# Patient Record
Sex: Male | Born: 1990 | Hispanic: Yes | Marital: Single | Smoking: Never smoker
Health system: Southern US, Community
[De-identification: ages and names within clinical notes are randomized; demographics above are authoritative.]

---

## 2019-11-20 ENCOUNTER — Emergency Department (HOSPITAL_COMMUNITY)
Admission: EM | Admit: 2019-11-20 | Discharge: 2019-11-20 | Disposition: A | Discharge status: Skilled Nursing Facility | Payer: Self-pay

## 2019-11-20 ENCOUNTER — Encounter: Payer: Self-pay | Admitting: Emergency Medicine

## 2019-11-20 ENCOUNTER — Ambulatory Visit
Admission: EM | Admit: 2019-11-20 | Discharge: 2019-11-20 | Disposition: A | Discharge status: Home / Self Care | Payer: Self-pay

## 2019-11-20 ENCOUNTER — Ambulatory Visit (INDEPENDENT_AMBULATORY_CARE_PROVIDER_SITE_OTHER): Payer: Self-pay

## 2019-11-20 ENCOUNTER — Other Ambulatory Visit: Payer: Self-pay

## 2019-11-20 DIAGNOSIS — M79644 Pain in right finger(s): Secondary | ICD-10-CM

## 2019-11-20 DIAGNOSIS — S62616A Displaced fracture of proximal phalanx of right little finger, initial encounter for closed fracture: Secondary | ICD-10-CM

## 2019-11-20 DIAGNOSIS — W11XXXA Fall on and from ladder, initial encounter: Secondary | ICD-10-CM

## 2019-11-20 NOTE — Discharge Instructions (Signed)
Please go to [facility] for further evaluation

## 2019-11-20 NOTE — ED Provider Notes (Signed)
[facility]    CSN: [id number] Arrival date & time: 11/20/19  1527      History   Chief Complaint Chief Complaint  Patient presents with  . Finger Injury    HPI Mirl Hillery is a 29 y.o. male.   29 year old male comes in for right fifth finger pain, swelling after fall 1 week ago. HPI obtained by patient through video translator. States fell off the ladder and landed onto the finger.  States had immediate swelling that has not improved, but has not worsened.  Has numbness, tingling to the fingers intermittently.  Thought this was going to resolve on own, and has been treating with ice compress, Tylenol/Motrin without relief. Given continued symptoms, came in for evaluation.      History reviewed. No pertinent past medical history.  There are no problems to display for this patient.   History reviewed. No pertinent surgical history.     Home Medications    Prior to Admission medications   Medication Sig Start Date End Date Taking? Authorizing Provider  acetaminophen (TYLENOL) 325 MG tablet Take 650 mg by mouth every 6 (six) hours as needed.   Yes [provider]    Family History Family History  Problem Relation Age of Onset  . Hypertension Mother     Social History Social History   Tobacco Use  . Smoking status: Never Smoker  Substance Use Topics  . Alcohol use: Yes  . Drug use: Not on file     Allergies   Patient has no known allergies.   Review of Systems Review of Systems  Reason unable to perform ROS: See HPI as above.     Physical Exam Triage Vital Signs ED Triage Vitals  Enc Vitals Group     BP 11/20/19 1546 112/69     Pulse Rate 11/20/19 1546 76     Resp 11/20/19 1546 18     Temp 11/20/19 1546 98.3 F (36.8 C)     Temp Source 11/20/19 1546 Oral     SpO2 11/20/19 1546 98 %     Weight --      Height --      Head Circumference --      Peak Flow --      Pain Score 11/20/19 1538 7     Pain Loc --      Pain  Edu? --      Excl. in GC? --    No data found.  Updated Vital Signs BP 112/69 (BP Location: Left Arm)   Pulse 76   Temp 98.3 F (36.8 C) (Oral)   Resp 18   SpO2 98%   Physical Exam Constitutional:      General: He is not in acute distress.    Appearance: Normal appearance. He is well-developed. He is not toxic-appearing or diaphoretic.  HENT:     Head: Normocephalic and atraumatic.  Eyes:     Conjunctiva/sclera: Conjunctivae normal.     Pupils: Pupils are equal, round, and reactive to light.  Pulmonary:     Effort: Pulmonary effort is normal. No respiratory distress.     Comments: Speaking in full sentences without difficulty Musculoskeletal:     Cervical back: Normal range of motion and neck supple.     Comments: Swelling, erythema, fluctuance to the right 5th PIP joint with scabbing. Tenderness to palpation of the joint. Digit flexed at 90 degrees, unable to move. NVI  Skin:    General: Skin is warm and dry.  Neurological:     Mental Status: He is alert and oriented to person, place, and time.      UC Treatments / Results  Labs (all labs ordered are listed, but only abnormal results are displayed) Labs Reviewed - No data to display  EKG   Radiology DG Finger Little Right  Result Date: 11/20/2019 CLINICAL DATA:  29 year old male status post fall from ladder 1 week ago with 5th finger injury. Pain and swelling. EXAM: RIGHT LITTLE FINGER 2+V COMPARISON:  None. FINDINGS: Impacted fracture at the right 5th PIP, where the base of the middle phalanx appears to be impacted on the condyle of the proximal phalanx. Little if any comminution. The 5th DIP appears preserved. Regional soft tissue swelling. The 5th metatarsal appears intact. IMPRESSION: Impacted fracture of the right 5th PIP with soft tissue swelling. Electronically Signed   By: Genevie Ann M.D.   On: 11/20/2019 16:28    Procedures Procedures (including critical care time)  Medications Ordered in UC Medications -  No data to display  Initial Impression / Assessment and Plan / UC Course  I have reviewed the triage vital signs and the nursing notes.  Pertinent labs & imaging results that were available during my care of the patient were reviewed by me and considered in my medical decision making (see chart for details).    Dr Lanny Cramp present during exam.  Given history, x-ray results, he suggested discharge to [facility] urgent care for further evaluation and management needed.  Resources provided, patient discharged in stable condition to [facility] urgent care for further evaluation management.  Final Clinical Impressions(s) / UC Diagnoses   Final diagnoses:  Closed displaced fracture of proximal phalanx of right ring finger, initial encounter   ED Prescriptions    None     PDMP not reviewed this encounter.   Ok Edwards, PA-C 11/20/19 1656

## 2019-11-20 NOTE — ED Triage Notes (Signed)
Pant states he fell from a ladder one week ago.  Fell on ground and body weight fell on finger.  Right little finger is grossly swollen, bruising, old open wound with scabbing.  Patient does have brisk cap refill to this finger.  Unable to straighten this finger

## 2021-02-28 IMAGING — DX DG FINGER LITTLE 2+V*R*
3 series · 3 of 3 positions shown · non-contrast
Comparison: None.

CLINICAL DATA: 28-year-old male status post fall from ladder 1 week
ago with 5th finger injury. Pain and swelling.

EXAM:
RIGHT LITTLE FINGER 2+V

[finger pa (1 of 2)]
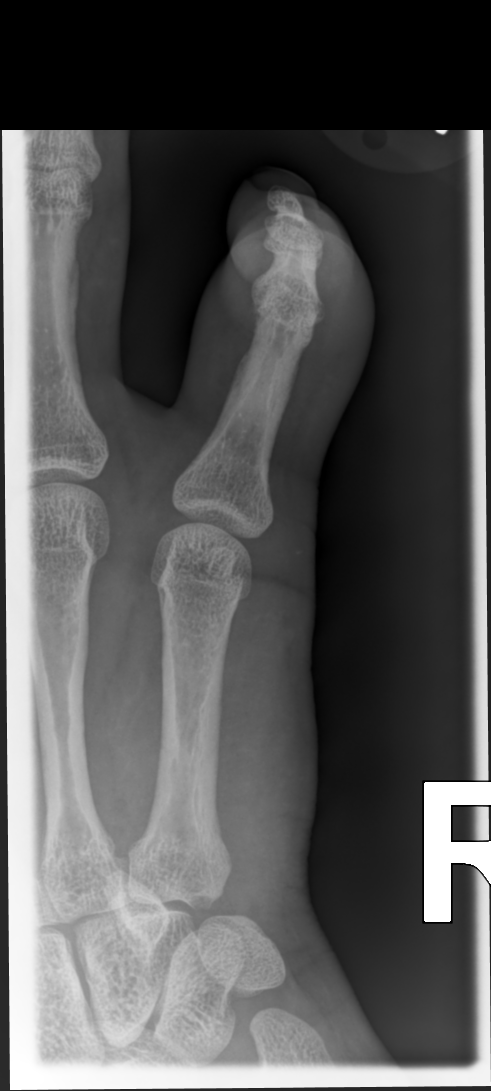

[finger pa (2 of 2)]
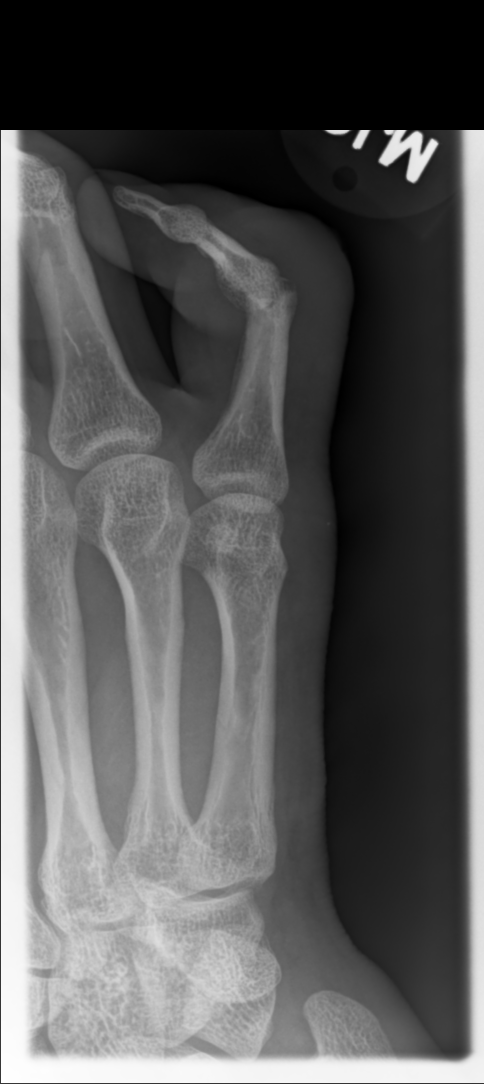

[finger lat]
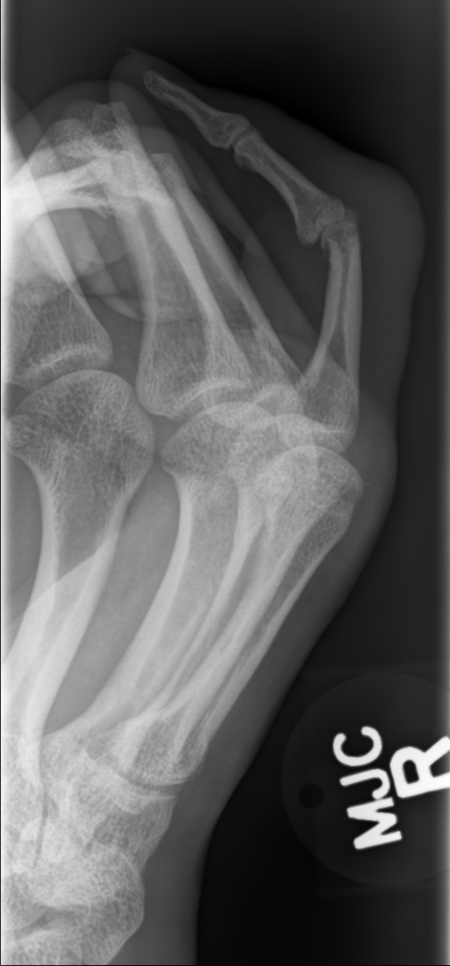

[3 of 3 positions shown; findings below may reference images not displayed]

FINDINGS: Impacted fracture at the right 5th PIP, where the base of the middle
phalanx appears to be impacted on the condyle of the proximal
phalanx. Little if any comminution. The 5th DIP appears preserved.
Regional soft tissue swelling. The 5th metatarsal appears intact.
IMPRESSION: Impacted fracture of the right 5th PIP with soft tissue swelling.
